# Patient Record
Sex: Male | Born: 1980 | Hispanic: No | Marital: Married | Smoking: Current some day smoker
Health system: Southern US, Community
[De-identification: ages and names within clinical notes are randomized; demographics above are authoritative.]

## PROBLEM LIST (undated history)

## (undated) ENCOUNTER — Emergency Department (HOSPITAL_COMMUNITY): Admission: EM | Payer: Self-pay

## (undated) DIAGNOSIS — A159 Respiratory tuberculosis unspecified: Secondary | ICD-10-CM

## (undated) DIAGNOSIS — K219 Gastro-esophageal reflux disease without esophagitis: Secondary | ICD-10-CM

## (undated) HISTORY — DX: Respiratory tuberculosis unspecified: A15.9

## (undated) HISTORY — DX: Gastro-esophageal reflux disease without esophagitis: K21.9

## (undated) HISTORY — PX: NO PAST SURGERIES: SHX2092

---

## 2014-01-08 ENCOUNTER — Other Ambulatory Visit: Payer: Self-pay | Admitting: Infectious Disease

## 2014-01-08 ENCOUNTER — Ambulatory Visit
Admission: RE | Admit: 2014-01-08 | Discharge: 2014-01-08 | Disposition: A | Discharge status: Home / Self Care | Payer: No Typology Code available for payment source | Source: Ambulatory Visit | Attending: Infectious Disease | Admitting: Infectious Disease

## 2014-01-08 DIAGNOSIS — A150 Tuberculosis of lung: Secondary | ICD-10-CM

## 2014-03-16 ENCOUNTER — Ambulatory Visit: Payer: Medicaid Other | Attending: Internal Medicine | Admitting: Internal Medicine

## 2014-03-16 ENCOUNTER — Encounter: Payer: Self-pay | Admitting: Internal Medicine

## 2014-03-16 VITALS — BP 119/71 | HR 94 | Temp 98.1°F | Resp 16 | Wt 138.8 lb

## 2014-03-16 DIAGNOSIS — R7611 Nonspecific reaction to tuberculin skin test without active tuberculosis: Secondary | ICD-10-CM | POA: Insufficient documentation

## 2014-03-16 DIAGNOSIS — L84 Corns and callosities: Secondary | ICD-10-CM | POA: Diagnosis not present

## 2014-03-16 DIAGNOSIS — Z139 Encounter for screening, unspecified: Secondary | ICD-10-CM | POA: Diagnosis not present

## 2014-03-16 DIAGNOSIS — M79609 Pain in unspecified limb: Secondary | ICD-10-CM | POA: Diagnosis not present

## 2014-03-16 DIAGNOSIS — F17200 Nicotine dependence, unspecified, uncomplicated: Secondary | ICD-10-CM | POA: Insufficient documentation

## 2014-03-16 DIAGNOSIS — K029 Dental caries, unspecified: Secondary | ICD-10-CM | POA: Insufficient documentation

## 2014-03-16 DIAGNOSIS — M79673 Pain in unspecified foot: Secondary | ICD-10-CM | POA: Insufficient documentation

## 2014-03-16 DIAGNOSIS — M79671 Pain in right foot: Secondary | ICD-10-CM

## 2014-03-16 LAB — COMPLETE METABOLIC PANEL WITH GFR
ALK PHOS: 63 U/L (ref 39–117)
ALT: 22 U/L (ref 0–53)
AST: 25 U/L (ref 0–37)
Albumin: 4.2 g/dL (ref 3.5–5.2)
BUN: 16 mg/dL (ref 6–23)
CO2: 29 mEq/L (ref 19–32)
CREATININE: 0.87 mg/dL (ref 0.50–1.35)
Calcium: 9.1 mg/dL (ref 8.4–10.5)
Chloride: 105 mEq/L (ref 96–112)
GFR, Est African American: >89 mL/min
GFR, Est Non African American: >89 mL/min
Glucose, Bld: 79 mg/dL (ref 70–99)
Potassium: 4.4 mEq/L (ref 3.5–5.3)
Sodium: 139 mEq/L (ref 135–145)
Total Bilirubin: 0.4 mg/dL (ref 0.2–1.2)
Total Protein: 6.8 g/dL (ref 6.0–8.3)

## 2014-03-16 LAB — CBC WITH DIFFERENTIAL/PLATELET
BASOS ABS: 0.1 10*3/uL (ref 0.0–0.1)
BASOS PCT: 1 % (ref 0–1)
EOS PCT: 4 % (ref 0–5)
Eosinophils Absolute: 0.2 10*3/uL (ref 0.0–0.7)
HEMATOCRIT: 43.5 % (ref 39.0–52.0)
Hemoglobin: 15.4 g/dL (ref 13.0–17.0)
Lymphocytes Relative: 28 % (ref 12–46)
Lymphs Abs: 1.5 10*3/uL (ref 0.7–4.0)
MCH: 30.9 pg (ref 26.0–34.0)
MCHC: 35.4 g/dL (ref 30.0–36.0)
MCV: 87.3 fL (ref 78.0–100.0)
MONO ABS: 0.4 10*3/uL (ref 0.1–1.0)
Monocytes Relative: 8 % (ref 3–12)
NEUTROS ABS: 3.2 10*3/uL (ref 1.7–7.7)
Neutrophils Relative %: 59 % (ref 43–77)
PLATELETS: 197 10*3/uL (ref 150–400)
RBC: 4.98 MIL/uL (ref 4.22–5.81)
RDW: 13.8 % (ref 11.5–15.5)
WBC: 5.4 10*3/uL (ref 4.0–10.5)

## 2014-03-16 LAB — LIPID PANEL
CHOL/HDL RATIO: 3.3 ratio
CHOLESTEROL: 130 mg/dL (ref 0–200)
HDL: 39 mg/dL — AB (ref >39)
LDL Cholesterol: 82 mg/dL (ref 0–99)
TRIGLYCERIDES: 43 mg/dL (ref <150)
VLDL: 9 mg/dL (ref 0–40)

## 2014-03-16 LAB — TSH: TSH: 1.848 u[IU]/mL (ref 0.350–4.500)

## 2014-03-16 MED ORDER — IBUPROFEN 600 MG PO TABS: 600.0000 mg | ORAL_TABLET | Freq: Three times a day (TID) | ORAL | Status: DC | PRN

## 2014-03-16 NOTE — Progress Notes (Signed)
Patient Demographics  Jesse Garza, is a 33 y.o. male  ZOX:[id number]  [id number]:[id number]  DOB - 06/12/1981  CC:  Chief Complaint  Patient presents with  . Establish Care       HPI: Jesse Garza is a 33 y.o. male here today to establish medical care. Patient has history of positive PPD and following up with health Department had a chest x-ray done in May which was negative for active disease he is on rifampin 600 mg for 4 months, patient denies any cough fever chills sweat loss, patient is complaining of right heel pain on and off for the last one year denies any fall or trauma. Patient also has lot of dental cavities and is requesting referral to see a dentist. Patient has No headache, No chest pain, No abdominal pain - No Nausea, No new weakness tingling or numbness, No Cough - SOB.  No Known Allergies Past Medical History  Diagnosis Date  . Tuberculosis    No current outpatient prescriptions on file prior to visit.   No current facility-administered medications on file prior to visit.   History reviewed. No pertinent family history. History   Social History  . Marital Status: Single    Spouse Name: N/A    Number of Children: N/A  . Years of Education: N/A   Occupational History  . Not on file.   Social History Main Topics  . Smoking status: Current Every Day Smoker  . Smokeless tobacco: Not on file  . Alcohol Use: No  . Drug Use: Not on file  . Sexual Activity: Not on file   Other Topics Concern  . Not on file   Social History Narrative  . No narrative on file    Review of Systems: Constitutional: Negative for fever, chills, diaphoresis, activity change, appetite change and fatigue. HENT: Negative for ear pain, nosebleeds, congestion, facial swelling, rhinorrhea, neck pain, neck stiffness and ear discharge.  Eyes: Negative for pain, discharge, redness, itching and visual disturbance. Respiratory: Negative for cough, choking, chest tightness, shortness  of breath, wheezing and stridor.  Cardiovascular: Negative for chest pain, palpitations and leg swelling. Gastrointestinal: Negative for abdominal distention. Genitourinary: Negative for dysuria, urgency, frequency, hematuria, flank pain, decreased urine volume, difficulty urinating and dyspareunia.  Musculoskeletal: Negative for back pain, joint swelling, arthralgia and gait problem. Neurological: Negative for dizziness, tremors, seizures, syncope, facial asymmetry, speech difficulty, weakness, light-headedness, numbness and headaches.  Hematological: Negative for adenopathy. Does not bruise/bleed easily. Psychiatric/Behavioral: Negative for hallucinations, behavioral problems, confusion, dysphoric mood, decreased concentration and agitation.    Objective:   Filed Vitals:   03/16/14 0937  BP: 119/71  Pulse: 94  Temp: 98.1 F (36.7 C)  Resp: 16    Physical Exam: Constitutional: Patient appears well-developed and well-nourished. No distress. HENT: Normocephalic, atraumatic, External right and left ear normal. Oropharynx is clear and moist.  Eyes: Conjunctivae and EOM are normal. PERRLA, no scleral icterus. Neck: Normal ROM. Neck supple. No JVD. No tracheal deviation. No thyromegaly. CVS: RRR, S1/S2 +, no murmurs, no gallops, no carotid bruit.  Pulmonary: Effort and breath sounds normal, no stridor, rhonchi, wheezes, rales.  Abdominal: Soft. BS +, no distension, tenderness, rebound or guarding.  Musculoskeletal: Normal range of motion. No edema and no tenderness. Right foot callus at heel minimal tenderness no erythema or discharge. Neuro: Alert. Normal reflexes, muscle tone coordination. No cranial nerve deficit. Skin: Skin is warm and dry. No rash noted. Not diaphoretic. No erythema. No pallor. Psychiatric: Normal mood  and affect. Behavior, judgment, thought content normal.  No results found for this basename: WBC, HGB, HCT, MCV, PLT   No results found for this basename:  CREATININE, BUN, NA, K, CL, CO2    No results found for this basename: HGBA1C   Lipid Panel  No results found for this basename: chol, trig, hdl, cholhdl, vldl, ldlcalc       Assessment and plan:   1. PPD positive Patient following up with  health Department and is on rifampin for 4 months  2. Smoking Advised patient to quit smoking.  3. Heel pain, right/4. Callus of heel  Advised patient to wear comfortable shoes. - DG Foot Complete Right; Future - ibuprofen (ADVIL,MOTRIN) 600 MG tablet; Take 1 tablet (600 mg total) by mouth every 8 (eight) hours as needed.  Dispense: 30 tablet; Refill: 1   5. Screening We'll do baseline blood work - CBC with Differential - COMPLETE METABOLIC PANEL WITH GFR - TSH - Lipid panel - Vit D  25 hydroxy (rtn osteoporosis monitoring)  6. Dental cavities  - Ambulatory referral to Dentistry   Return in about 3 months (around 06/16/2014).   Doris Cheadle, MD

## 2014-03-16 NOTE — Progress Notes (Signed)
Patient here with interpreter Here to establish care Complain of callus to bottom of his right foot That has been there over a year Currently taking medication for TB

## 2014-03-17 ENCOUNTER — Other Ambulatory Visit: Payer: Self-pay

## 2014-03-17 ENCOUNTER — Telehealth: Payer: Self-pay

## 2014-03-17 LAB — VITAMIN D 25 HYDROXY (VIT D DEFICIENCY, FRACTURES): Vit D, 25-Hydroxy: 18 ng/mL — ABNORMAL LOW (ref 30–89)

## 2014-03-17 MED ORDER — VITAMIN D (ERGOCALCIFEROL) 1.25 MG (50000 UNIT) PO CAPS: 50000.0000 [IU] | ORAL_CAPSULE | ORAL | Status: DC

## 2014-03-17 NOTE — Telephone Encounter (Signed)
Message copied by Lestine MountJUAREZ, Keino Placencia L on Wed Mar 17, 2014 10:44 AM ------      Message from: Doris CheadleADVANI, DEEPAK      Created: Wed Mar 17, 2014  9:35 AM       Blood work reviewed, noticed low vitamin D, call patient advise to start ergocalciferol 50,000 units once a week for the duration of  12 weeks.        ------

## 2014-03-17 NOTE — Telephone Encounter (Signed)
Interpreter line used Patient not available Message left to return our call 

## 2015-04-15 ENCOUNTER — Ambulatory Visit: Payer: Medicaid Other | Admitting: Family Medicine

## 2015-12-13 ENCOUNTER — Emergency Department (HOSPITAL_COMMUNITY)
Admission: EM | Admit: 2015-12-13 | Discharge: 2015-12-13 | Disposition: A | Discharge status: Home / Self Care | Payer: BLUE CROSS/BLUE SHIELD | Attending: Emergency Medicine | Admitting: Emergency Medicine

## 2015-12-13 ENCOUNTER — Emergency Department (HOSPITAL_COMMUNITY): Payer: BLUE CROSS/BLUE SHIELD

## 2015-12-13 ENCOUNTER — Encounter (HOSPITAL_COMMUNITY): Payer: Self-pay | Admitting: Emergency Medicine

## 2015-12-13 DIAGNOSIS — B349 Viral infection, unspecified: Secondary | ICD-10-CM | POA: Insufficient documentation

## 2015-12-13 DIAGNOSIS — F17200 Nicotine dependence, unspecified, uncomplicated: Secondary | ICD-10-CM | POA: Insufficient documentation

## 2015-12-13 DIAGNOSIS — Z8611 Personal history of tuberculosis: Secondary | ICD-10-CM | POA: Diagnosis not present

## 2015-12-13 DIAGNOSIS — R05 Cough: Secondary | ICD-10-CM | POA: Diagnosis present

## 2015-12-13 MED ORDER — BENZONATATE 100 MG PO CAPS: 100.0000 mg | ORAL_CAPSULE | Freq: Three times a day (TID) | ORAL | Status: DC

## 2015-12-13 NOTE — ED Notes (Signed)
Pt. reports persistent productive cough with nasal congestion / runny nose and chest congestion onset last week , respirations unlabored , denies fever or chills.

## 2015-12-13 NOTE — Discharge Instructions (Signed)
Please read and follow all provided instructions.  Your diagnoses today include:  1. Viral syndrome    You appear to have an upper respiratory infection (URI). An upper respiratory tract infection, or cold, is a viral infection of the air passages leading to the lungs. It should improve gradually after 5-7 days. You may have a lingering cough that lasts for 2- 4 weeks after the infection.  Tests performed today include:  Vital signs. See below for your results today.   Medications prescribed:   Take any prescribed medications only as directed. Treatment for your infection is aimed at treating the symptoms. There are no medications, such as antibiotics, that will cure your infection.   Home care instructions:  Follow any educational materials contained in this packet.   Your illness is contagious and can be spread to others, especially during the first 3 or 4 days. It cannot be cured by antibiotics or other medicines. Take basic precautions such as washing your hands often, covering your mouth when you cough or sneeze, and avoiding public places where you could spread your illness to others.   Please continue drinking plenty of fluids.  Use over-the-counter medicines as needed as directed on packaging for symptom relief.  You may also use ibuprofen or tylenol as directed on packaging for pain or fever.  Do not take multiple medicines containing Tylenol or acetaminophen to avoid taking too much of this medication.  Follow-up instructions: Please follow-up with your primary care provider in the next 3 days for further evaluation of your symptoms if you are not feeling better.   Return instructions:   Please return to the Emergency Department if you experience worsening symptoms.   RETURN IMMEDIATELY IF you develop shortness of breath, confusion or altered mental status, a new rash, become dizzy, faint, or poorly responsive, or are unable to be cared for at home.  Please return if you have  persistent vomiting and cannot keep down fluids or develop a fever that is not controlled by tylenol or motrin.    Please return if you have any other emergent concerns.  Additional Information:  Your vital signs today were: BP 117/72 mmHg   Pulse 94   Temp(Src) 99.4 F (37.4 C) (Oral)   Resp 16   Ht 5\' 7"  (1.702 m)   Wt 66.225 kg   BMI 22.86 kg/m2   SpO2 98% If your blood pressure (BP) was elevated above 135/85 this visit, please have this repeated by your doctor within one month. --------------

## 2015-12-13 NOTE — ED Provider Notes (Signed)
CSN: [id number]     Arrival date & time 12/13/15  2017 History   First MD Initiated Contact with Patient 12/13/15 2104     Chief Complaint  Patient presents with  . Cough  . Nasal Congestion   (Consider location/radiation/quality/duration/timing/severity/associated sxs/prior Treatment) HPI  35 y.o. male presents to the Emergency Department today complaining of URI symptoms x 6 days. Notes rhinorrhea, body aches, productive, cough, congestion. No CP/SOB/ABD pain. No N/V/D. No fevers. Notes sick contacts at work. Has tried OTC ibuprofen with minimal relief. No pain currently. No other symptoms noted.    Past Medical History  Diagnosis Date  . Tuberculosis    History reviewed. No pertinent past surgical history. No family history on file. Social History  Substance Use Topics  . Smoking status: Current Every Day Smoker  . Smokeless tobacco: None  . Alcohol Use: No    Review of Systems  Constitutional: Negative for fever.  Respiratory: Positive for cough. Negative for wheezing and stridor.   Cardiovascular: Negative for chest pain.  Gastrointestinal: Negative for nausea, vomiting and diarrhea.   Allergies  Review of patient's allergies indicates no known allergies.  Home Medications   Prior to Admission medications   Medication Sig Start Date End Date Taking? Authorizing Provider  ibuprofen (ADVIL,MOTRIN) 600 MG tablet Take 1 tablet (600 mg total) by mouth every 8 (eight) hours as needed. 03/16/14   Doris Cheadle, MD  Vitamin D, Ergocalciferol, (DRISDOL) 50000 UNITS CAPS capsule Take 1 capsule (50,000 Units total) by mouth every 7 (seven) days. 03/17/14   Doris Cheadle, MD   BP 117/72 mmHg  Pulse 94  Temp(Src) 99.4 F (37.4 C) (Oral)  Resp 16  Ht  (1.702 m)  Wt 66.225 kg  BMI 22.86 kg/m2  SpO2 98%\  Physical Exam  Constitutional: He is oriented to person, place, and time. He appears well-developed and well-nourished. No distress.  HENT:  Head: Normocephalic and  atraumatic.  Right Ear: Tympanic membrane, external ear and ear canal normal.  Left Ear: Tympanic membrane, external ear and ear canal normal.  Nose: Nose normal.  Mouth/Throat: Uvula is midline, oropharynx is clear and moist and mucous membranes are normal. No trismus in the jaw. No oropharyngeal exudate, posterior oropharyngeal erythema or tonsillar abscesses.  Eyes: EOM are normal. Pupils are equal, round, and reactive to light.  Neck: Normal range of motion. Neck supple. No tracheal deviation present.  Cardiovascular: Normal rate, regular rhythm, S1 normal, S2 normal, normal heart sounds, intact distal pulses and normal pulses.   Pulmonary/Chest: Effort normal and breath sounds normal. No respiratory distress. He has no decreased breath sounds. He has no wheezes. He has no rhonchi. He has no rales.  Abdominal: Normal appearance and bowel sounds are normal. There is no tenderness.  Musculoskeletal: Normal range of motion.  Neurological: He is alert and oriented to person, place, and time.  Skin: Skin is warm and dry.  Psychiatric: He has a normal mood and affect. His speech is normal and behavior is normal. Thought content normal.   ED Course  Procedures (including critical care time) Labs Review Labs Reviewed - No data to display  Imaging Review Dg Chest 2 View  12/13/2015  CLINICAL DATA:  Productive cough with nasal congestion EXAM: CHEST  2 VIEW COMPARISON:  01/08/2014 FINDINGS: Cardiac shadow is within normal limits. The lungs are well aerated bilaterally. No focal infiltrate or sizable effusion is seen. No acute bony abnormality is noted. IMPRESSION: No active cardiopulmonary disease. Electronically Signed  By: Alcide CleverMark  Lukens M.D.   On: 12/13/2015 20:52   I have personally reviewed and evaluated these images and lab results as part of my medical decision-making.   EKG Interpretation None      MDM  I have reviewed and evaluated the relevant imaging studies.  I have reviewed the  relevant previous healthcare records. I obtained HPI from historian.  ED Course:  Assessment: Pt is a 35yM presents with URI symptoms x 6 days . On exam, pt in NAD. VSS. Afebrile. Lungs CTA, Heart RRR. Abdomen nontender/soft. Pt CXR negative for acute infiltrate. Patients symptoms are consistent with URI, likely viral etiology. Discussed that antibiotics are not indicated for viral infections. Pt will be discharged with symptomatic treatment.  Given Rx Tessalon. Verbalizes understanding and is agreeable with plan. Pt is hemodynamically stable & in NAD prior to dc.  Disposition/Plan:  DC Home Additional Verbal discharge instructions given and discussed with patient.  Pt Instructed to f/u with PCP in the next week for evaluation and treatment of symptoms. Return precautions given Pt acknowledges and agrees with plan  Supervising Physician No att. providers found   Final diagnoses:  Viral syndrome       Audry Piliyler Devontay Celaya, PA-C 12/13/15 2210  Arby BarretteMarcy Pfeiffer, MD 12/13/15 2322

## 2015-12-13 NOTE — ED Notes (Signed)
While using interpreter phone pt st's he has had cold symptoms x's 1 week.   Pt denies any nausea, vomiting or diarrhea.

## 2015-12-22 ENCOUNTER — Emergency Department (HOSPITAL_COMMUNITY)
Admission: EM | Admit: 2015-12-22 | Discharge: 2015-12-22 | Disposition: A | Discharge status: Home / Self Care | Payer: BLUE CROSS/BLUE SHIELD | Attending: Emergency Medicine | Admitting: Emergency Medicine

## 2015-12-22 ENCOUNTER — Emergency Department (HOSPITAL_COMMUNITY): Payer: BLUE CROSS/BLUE SHIELD

## 2015-12-22 ENCOUNTER — Encounter (HOSPITAL_COMMUNITY): Payer: Self-pay | Admitting: *Deleted

## 2015-12-22 DIAGNOSIS — J988 Other specified respiratory disorders: Secondary | ICD-10-CM | POA: Insufficient documentation

## 2015-12-22 DIAGNOSIS — H5710 Ocular pain, unspecified eye: Secondary | ICD-10-CM | POA: Diagnosis not present

## 2015-12-22 DIAGNOSIS — Z79899 Other long term (current) drug therapy: Secondary | ICD-10-CM | POA: Diagnosis not present

## 2015-12-22 DIAGNOSIS — R067 Sneezing: Secondary | ICD-10-CM | POA: Diagnosis present

## 2015-12-22 DIAGNOSIS — R42 Dizziness and giddiness: Secondary | ICD-10-CM | POA: Diagnosis not present

## 2015-12-22 DIAGNOSIS — R51 Headache: Secondary | ICD-10-CM | POA: Diagnosis not present

## 2015-12-22 DIAGNOSIS — Z8611 Personal history of tuberculosis: Secondary | ICD-10-CM | POA: Diagnosis not present

## 2015-12-22 DIAGNOSIS — F17200 Nicotine dependence, unspecified, uncomplicated: Secondary | ICD-10-CM | POA: Diagnosis not present

## 2015-12-22 MED ORDER — PROMETHAZINE-DM 6.25-15 MG/5ML PO SYRP
5.0000 mL | ORAL_SOLUTION | Freq: Four times a day (QID) | ORAL | Status: DC | PRN
Start: 2015-12-22 — End: 2016-11-20

## 2015-12-22 MED ORDER — AMOXICILLIN 500 MG PO CAPS: 500.0000 mg | ORAL_CAPSULE | Freq: Three times a day (TID) | ORAL | Status: DC

## 2015-12-22 MED ORDER — GUAIFENESIN 100 MG/5ML PO LIQD: 100.0000 mg | ORAL | Status: DC | PRN

## 2015-12-22 NOTE — ED Provider Notes (Signed)
CSN: [id number]     Arrival date & time 12/22/15  1155 History  By signing my name below, I, Emmanuella Mensah, attest that this documentation has been prepared under the direction and in the presence of Fayrene HelperBowie Finn Amos, PA-C. Electronically Signed: Angelene GiovanniEmmanuella Mensah, ED Scribe. 12/22/2015. 1:33 PM.    No chief complaint on file.  The history is provided by the patient. A language interpreter was used (Pt speaks arabic).   HPI Comments: Jesse Garza is a 35 y.o. male with a hx of TB who presents to the Emergency Department complaining of gradually worsening persistent intermittent non-productive cough onset 2-3 weeks ago. Pt reports associated HA, dizziness, clear watery rhinorrhea, sneezing, and eye pain. Pt explains that he was seen on 12/13/15 where he had a normal chest x-ray and given Tessalon. Pt states that he is here today because his symptoms are still persisting with the Tessalon. He has been living in [place] for approx. 3 years. He denies any drug or cocaine use. He reports sick contacts at work with similar symptoms. Pt denies any fever, chills, eye itchiness, rash, or nasal congestion. Pt denies night sweat, fever, or hemoptysis.   Past Medical History  Diagnosis Date  . Tuberculosis    History reviewed. No pertinent past surgical history. No family history on file. Social History  Substance Use Topics  . Smoking status: Current Every Day Smoker  . Smokeless tobacco: None  . Alcohol Use: No    Review of Systems  Constitutional: Negative for fever and chills.  HENT: Positive for rhinorrhea and sneezing. Negative for congestion.   Eyes: Positive for pain. Negative for itching.  Respiratory: Positive for cough.   Skin: Negative for rash.  Neurological: Positive for dizziness and headaches.  All other systems reviewed and are negative.     Allergies  Review of patient's allergies indicates no known allergies.  Home Medications   Prior to Admission medications    Medication Sig Start Date End Date Taking? Authorizing Provider  benzonatate (TESSALON) 100 MG capsule Take 1 capsule (100 mg total) by mouth every 8 (eight) hours. 12/13/15   Audry Piliyler Mohr, PA-C  ibuprofen (ADVIL,MOTRIN) 600 MG tablet Take 1 tablet (600 mg total) by mouth every 8 (eight) hours as needed. 03/16/14   Doris Cheadleeepak Advani, MD  Vitamin D, Ergocalciferol, (DRISDOL) 50000 UNITS CAPS capsule Take 1 capsule (50,000 Units total) by mouth every 7 (seven) days. 03/17/14   Doris Cheadleeepak Advani, MD   BP 107/77 mmHg  Pulse 93  Temp(Src) 98.7 F (37.1 C) (Oral)  Resp 14  Ht 5\' 4"  (1.626 m)  Wt 148 lb (67.132 kg)  BMI 25.39 kg/m2  SpO2 100% Physical Exam  Constitutional: He is oriented to person, place, and time. He appears well-developed and well-nourished.  Non toxic looking  HENT:  Head: Normocephalic and atraumatic.  Nose: Rhinorrhea present.  Nasal congestion  Cardiovascular: Normal rate, regular rhythm and normal heart sounds.   Pulmonary/Chest: Effort normal.  Lymphadenopathy:    He has no cervical adenopathy.  Neurological: He is alert and oriented to person, place, and time.  Skin: Skin is warm and dry.  Psychiatric: He has a normal mood and affect.  Nursing note and vitals reviewed.   ED Course  Procedures (including critical care time) DIAGNOSTIC STUDIES: Oxygen Saturation is 100% on RA, normal by my interpretation.    COORDINATION OF CARE: 1:22 PM- Pt advised of plan for treatment and pt agrees. Explained that since pt's symptoms have been persisting for weeks, it is  likely that he will develop a bacterial infection and will prescribe antibiotics. Will complete work return note and give pt work note for 3 days. Pt request additional days but explained that in the ED, pt can only receive up to 3 days and will need to follow up with PCP for additional days.   MDM   Final diagnoses:  Respiratory infection    BP 107/77 mmHg  Pulse 93  Temp(Src) 98.7 F (37.1 C) (Oral)  Resp 14   Ht  (1.626 m)  Wt 67.132 kg  BMI 25.39 kg/m2  SpO2 100%   I personally performed the services described in this documentation, which was scribed in my presence. The recorded information has been reviewed and is accurate.     Fayrene Helper, PA-C 12/22/15 1340  Fayrene Helper, PA-C 12/22/15 1443  Gwyneth Sprout, MD 12/22/15 2227

## 2015-12-22 NOTE — ED Notes (Addendum)
Pt c/o cough, sneezing, HA x 3 wks, pt seen 12/13/15 for symptoms, dx with viral syndrome, presents to ED today c/o similar symptoms, needs return to work form to be completed for work, pt hx of TB, pt A&O x4

## 2015-12-22 NOTE — Discharge Instructions (Signed)
Upper Respiratory Infection, Adult Most upper respiratory infections (URIs) are a viral infection of the air passages leading to the lungs. A URI affects the nose, throat, and upper air passages. The most common type of URI is nasopharyngitis and is typically referred to as "the common cold." URIs run their course and usually go away on their own. Most of the time, a URI does not require medical attention, but sometimes a bacterial infection in the upper airways can follow a viral infection. This is called a secondary infection. Sinus and middle ear infections are common types of secondary upper respiratory infections. Bacterial pneumonia can also complicate a URI. A URI can worsen asthma and chronic obstructive pulmonary disease (COPD). Sometimes, these complications can require emergency medical care and may be life threatening.  CAUSES Almost all URIs are caused by viruses. A virus is a type of germ and can spread from one person to another.  RISKS FACTORS You may be at risk for a URI if:   You smoke.   You have chronic heart or lung disease.  You have a weakened defense (immune) system.   You are very young or very old.   You have nasal allergies or asthma.  You work in crowded or poorly ventilated areas.  You work in health care facilities or schools. SIGNS AND SYMPTOMS  Symptoms typically develop 2-3 days after you come in contact with a cold virus. Most viral URIs last 7-10 days. However, viral URIs from the influenza virus (flu virus) can last 14-18 days and are typically more severe. Symptoms may include:   Runny or stuffy (congested) nose.   Sneezing.   Cough.   Sore throat.   Headache.   Fatigue.   Fever.   Loss of appetite.   Pain in your forehead, behind your eyes, and over your cheekbones (sinus pain).  Muscle aches.  DIAGNOSIS  Your health care provider may diagnose a URI by:  Physical exam.  Tests to check that your symptoms are not due to  another condition such as:  Strep throat.  Sinusitis.  Pneumonia.  Asthma. TREATMENT  A URI goes away on its own with time. It cannot be cured with medicines, but medicines may be prescribed or recommended to relieve symptoms. Medicines may help:  Reduce your fever.  Reduce your cough.  Relieve nasal congestion. HOME CARE INSTRUCTIONS   Take medicines only as directed by your health care provider.   Gargle warm saltwater or take cough drops to comfort your throat as directed by your health care provider.  Use a warm mist humidifier or inhale steam from a shower to increase air moisture. This may make it easier to breathe.  Drink enough fluid to keep your urine clear or pale yellow.   Eat soups and other clear broths and maintain good nutrition.   Rest as needed.   Return to work when your temperature has returned to normal or as your health care provider advises. You may need to stay home longer to avoid infecting others. You can also use a face mask and careful hand washing to prevent spread of the virus.  Increase the usage of your inhaler if you have asthma.   Do not use any tobacco products, including cigarettes, chewing tobacco, or electronic cigarettes. If you need help quitting, ask your health care provider. PREVENTION  The best way to protect yourself from getting a cold is to practice good hygiene.   Avoid oral or hand contact with people with cold   symptoms.   Wash your hands often if contact occurs.  There is no clear evidence that vitamin C, vitamin E, echinacea, or exercise reduces the chance of developing a cold. However, it is always recommended to get plenty of rest, exercise, and practice good nutrition.  SEEK MEDICAL CARE IF:   You are getting worse rather than better.   Your symptoms are not controlled by medicine.   You have chills.  You have worsening shortness of breath.  You have brown or red mucus.  You have yellow or brown nasal  discharge.  You have pain in your face, especially when you bend forward.  You have a fever.  You have swollen neck glands.  You have pain while swallowing.  You have white areas in the back of your throat. SEEK IMMEDIATE MEDICAL CARE IF:   You have severe or persistent:  Headache.  Ear pain.  Sinus pain.  Chest pain.  You have chronic lung disease and any of the following:  Wheezing.  Prolonged cough.  Coughing up blood.  A change in your usual mucus.  You have a stiff neck.  You have changes in your:  Vision.  Hearing.  Thinking.  Mood. MAKE SURE YOU:   Understand these instructions.  Will watch your condition.  Will get help right away if you are not doing well or get worse.   This information is not intended to replace advice given to you by your health care provider. Make sure you discuss any questions you have with your health care provider.   Document Released: 01/23/2001 Document Revised: 12/14/2014 Document Reviewed: 11/04/2013 Elsevier Interactive Patient Education 2016 [employer]  

## 2016-05-04 ENCOUNTER — Ambulatory Visit (INDEPENDENT_AMBULATORY_CARE_PROVIDER_SITE_OTHER): Payer: BLUE CROSS/BLUE SHIELD

## 2016-05-04 ENCOUNTER — Ambulatory Visit (HOSPITAL_COMMUNITY)
Admission: EM | Admit: 2016-05-04 | Discharge: 2016-05-04 | Disposition: A | Discharge status: Home / Self Care | Payer: BLUE CROSS/BLUE SHIELD

## 2016-05-04 ENCOUNTER — Ambulatory Visit (HOSPITAL_COMMUNITY)
Admission: EM | Admit: 2016-05-04 | Discharge: 2016-05-04 | Disposition: A | Discharge status: Home / Self Care | Payer: BLUE CROSS/BLUE SHIELD | Attending: Internal Medicine | Admitting: Internal Medicine

## 2016-05-04 ENCOUNTER — Encounter (HOSPITAL_COMMUNITY): Payer: Self-pay | Admitting: Emergency Medicine

## 2016-05-04 DIAGNOSIS — R1012 Left upper quadrant pain: Secondary | ICD-10-CM

## 2016-05-04 DIAGNOSIS — K5900 Constipation, unspecified: Secondary | ICD-10-CM

## 2016-05-04 LAB — POCT I-STAT, CHEM 8
BUN: 15 mg/dL (ref 6–20)
CALCIUM ION: 1.18 mmol/L (ref 1.15–1.40)
CREATININE: 1 mg/dL (ref 0.61–1.24)
Chloride: 105 mmol/L (ref 101–111)
GLUCOSE: 98 mg/dL (ref 65–99)
HCT: 49 % (ref 39.0–52.0)
HEMOGLOBIN: 16.7 g/dL (ref 13.0–17.0)
Potassium: 4.7 mmol/L (ref 3.5–5.1)
Sodium: 141 mmol/L (ref 135–145)
TCO2: 27 mmol/L (ref 0–100)

## 2016-05-04 LAB — POCT H PYLORI SCREEN: H. PYLORI SCREEN, POC: NEGATIVE

## 2016-05-04 NOTE — Discharge Instructions (Signed)
1. Drink at least 8 large glasses of water daily and continue regular activities as                 tolerated.  °2. With each subsequent day with dealing with the constipation, please continue all the                ones that was/were started the previous day and add 1 new thing each day ° A 1 cup of prune juice and orange juice (warmed) orally twice daily ° B. mirlax 17 grams in large glass of water and follow with another glass of water                 up to twice daily. ° C.senna-S 2 orally up to twice daily ° D.mil of magnesia 30 ml orally up to twice daily ° E. Fleet enema rectally as needed ° °3. Once your bowel has moved, you can cut down on the above to regular bowl habit. Most people should have a movement every 1-2 days °4. NOTE: Do not start a fiber supplement during an episode of constipation. ° °

## 2016-05-04 NOTE — ED Provider Notes (Signed)
CSN: [id number]     Arrival date & time 05/04/16  1556 History   First MD Initiated Contact with Patient 05/04/16 1627     Chief Complaint  Patient presents with  . Abdominal Pain   (Consider location/radiation/quality/duration/timing/severity/associated sxs/prior Treatment) HPI History is obtained through an [place] interpreter. Patient is a 35 year old male who is complaining of some left upper quadrant pain that he has had for over 2 months now. He states that he has had problems with his bowels his stools are quite hard and infrequent. He denies any blood in the stools no other symptoms. He states he has had tuberculosis in the past. Does not have a primary medical provider. Denies history of sickle cell disease. Past Medical History:  Diagnosis Date  . Tuberculosis    History reviewed. No pertinent surgical history. History reviewed. No pertinent family history. Social History  Substance Use Topics  . Smoking status: Current Every Day Smoker  . Smokeless tobacco: Not on file  . Alcohol use No    Review of Systems  Denies: HEADACHE, NAUSEA, ABDOMINAL PAIN, CHEST PAIN, CONGESTION, DYSURIA, SHORTNESS OF BREATH  Allergies  Review of patient's allergies indicates no known allergies.  Home Medications   Prior to Admission medications   Medication Sig Start Date End Date Taking? Authorizing Provider  amoxicillin (AMOXIL) 500 MG capsule Take 1 capsule (500 mg total) by mouth 3 (three) times daily. 12/22/15   Fayrene HelperBowie Tran, PA-C  benzonatate (TESSALON) 100 MG capsule Take 1 capsule (100 mg total) by mouth every 8 (eight) hours. 12/13/15   Audry Piliyler Mohr, PA-C  guaiFENesin (ROBITUSSIN) 100 MG/5ML liquid Take 5-10 mLs (100-200 mg total) by mouth every 4 (four) hours as needed for congestion. 12/22/15   Fayrene HelperBowie Tran, PA-C  ibuprofen (ADVIL,MOTRIN) 600 MG tablet Take 1 tablet (600 mg total) by mouth every 8 (eight) hours as needed. 03/16/14   Doris Cheadleeepak Advani, MD  promethazine-dextromethorphan  (PROMETHAZINE-DM) 6.25-15 MG/5ML syrup Take 5 mLs by mouth 4 (four) times daily as needed for cough. 12/22/15   Fayrene HelperBowie Tran, PA-C  Vitamin D, Ergocalciferol, (DRISDOL) 50000 UNITS CAPS capsule Take 1 capsule (50,000 Units total) by mouth every 7 (seven) days. 03/17/14   Doris Cheadleeepak Advani, MD   Meds Ordered and Administered this Visit  Medications - No data to display  BP 108/74 (BP Location: Left Arm)   Pulse 92   Temp 98.9 F (37.2 C) (Oral)   Resp 16   SpO2 99%  No data found.   Physical Exam NURSES NOTES AND VITAL SIGNS REVIEWED. CONSTITUTIONAL: Well developed, well nourished, no acute distress HEENT: normocephalic, atraumatic EYES: Conjunctiva normal NECK:normal ROM, supple, no adenopathy PULMONARY:No respiratory distress, normal effort ABDOMINAL: Soft, ND, NT BS+, No CVAT, there is no splenomegaly. MUSCULOSKELETAL: Normal ROM of all extremities,  SKIN: warm and dry without rash PSYCHIATRIC: Mood and affect, behavior are normal  Urgent Care Course   Clinical Course    Procedures (including critical care time)  Labs Review Labs Reviewed  POCT I-STAT, CHEM 8  POCT H PYLORI SCREEN    Imaging Review Dg Abd 1 View  Result Date: 05/04/2016 CLINICAL DATA:  Left upper quadrant abdominal pain for 1 week. EXAM: ABDOMEN - 1 VIEW COMPARISON:  None. FINDINGS: The bowel gas pattern is unremarkable. No findings for small bowel obstruction or free air. The soft tissue shadows are maintained. No worrisome calcifications. The bony structures are unremarkable. IMPRESSION: Unremarkable abdominal radiograph. Electronically Signed   By: Rudie MeyerP.  Gallerani M.D.   On: 05/04/2016  18:06   Discussed with patient prior to discharge.  Visual Acuity Review  Right Eye Distance:   Left Eye Distance:   Bilateral Distance:    Right Eye Near:   Left Eye Near:    Bilateral Near:       As explained to patient through interpreter there is no definitive reason for him having left upper quadrant pain. This  may need to be further reviewed with proper imaging studies such as ultrasound or CT scan which needs to be organized outside around of urgent care. He is given instructions for constipation since he states that his stools are hard and infrequent although this is not apparent from his x-ray.  MDM   1. Left upper quadrant pain     Patient is reassured that there are no issues that require transfer to higher level of care at this time or additional tests. Patient is advised to continue home symptomatic treatment. Patient is advised that if there are new or worsening symptoms to attend the emergency department, contact primary care provider, or return to UC. Instructions of care provided discharged home in stable condition.    THIS NOTE WAS GENERATED USING A VOICE RECOGNITION SOFTWARE PROGRAM. ALL REASONABLE EFFORTS  WERE MADE TO PROOFREAD THIS DOCUMENT FOR ACCURACY.  I have verbally reviewed the discharge instructions with the patient. A printed AVS was given to the patient.  All questions were answered prior to discharge.      Tharon Aquas, PA 05/04/16 2059

## 2016-05-04 NOTE — ED Triage Notes (Signed)
[facility] with a complaint of LUQ abdominal pain x 1 week.

## 2016-11-07 ENCOUNTER — Encounter: Payer: Self-pay | Admitting: Physician Assistant

## 2016-11-20 ENCOUNTER — Encounter (INDEPENDENT_AMBULATORY_CARE_PROVIDER_SITE_OTHER): Payer: Self-pay

## 2016-11-20 ENCOUNTER — Encounter: Payer: Self-pay | Admitting: Physician Assistant

## 2016-11-20 ENCOUNTER — Ambulatory Visit (INDEPENDENT_AMBULATORY_CARE_PROVIDER_SITE_OTHER): Payer: BLUE CROSS/BLUE SHIELD | Admitting: Physician Assistant

## 2016-11-20 ENCOUNTER — Other Ambulatory Visit (INDEPENDENT_AMBULATORY_CARE_PROVIDER_SITE_OTHER): Payer: BLUE CROSS/BLUE SHIELD

## 2016-11-20 VITALS — BP 104/78 | HR 74 | Ht 65.5 in | Wt 151.0 lb

## 2016-11-20 DIAGNOSIS — R1010 Upper abdominal pain, unspecified: Secondary | ICD-10-CM | POA: Diagnosis not present

## 2016-11-20 DIAGNOSIS — R140 Abdominal distension (gaseous): Secondary | ICD-10-CM

## 2016-11-20 DIAGNOSIS — K219 Gastro-esophageal reflux disease without esophagitis: Secondary | ICD-10-CM

## 2016-11-20 LAB — CBC WITH DIFFERENTIAL/PLATELET
BASOS ABS: 0.1 10*3/uL (ref 0.0–0.1)
Basophils Relative: 1 % (ref 0.0–3.0)
Eosinophils Absolute: 0.2 10*3/uL (ref 0.0–0.7)
Eosinophils Relative: 3.2 % (ref 0.0–5.0)
HCT: 45.7 % (ref 39.0–52.0)
Hemoglobin: 15.9 g/dL (ref 13.0–17.0)
LYMPHS ABS: 1.9 10*3/uL (ref 0.7–4.0)
LYMPHS PCT: 27.7 % (ref 12.0–46.0)
MCHC: 34.9 g/dL (ref 30.0–36.0)
MCV: 90.4 fl (ref 78.0–100.0)
MONO ABS: 0.4 10*3/uL (ref 0.1–1.0)
Monocytes Relative: 6.1 % (ref 3.0–12.0)
NEUTROS ABS: 4.4 10*3/uL (ref 1.4–7.7)
NEUTROS PCT: 62 % (ref 43.0–77.0)
PLATELETS: 186 10*3/uL (ref 150.0–400.0)
RBC: 5.05 Mil/uL (ref 4.22–5.81)
RDW: 12.6 % (ref 11.5–15.5)
WBC: 7 10*3/uL (ref 4.0–10.5)

## 2016-11-20 LAB — H. PYLORI ANTIBODY, IGG: H Pylori IgG: POSITIVE — AB

## 2016-11-20 LAB — COMPREHENSIVE METABOLIC PANEL
ALT: 18 U/L (ref 0–53)
AST: 20 U/L (ref 0–37)
Albumin: 4.1 g/dL (ref 3.5–5.2)
Alkaline Phosphatase: 65 U/L (ref 39–117)
BILIRUBIN TOTAL: 0.4 mg/dL (ref 0.2–1.2)
BUN: 15 mg/dL (ref 6–23)
CHLORIDE: 107 meq/L (ref 96–112)
CO2: 27 meq/L (ref 19–32)
CREATININE: 0.96 mg/dL (ref 0.40–1.50)
Calcium: 9.1 mg/dL (ref 8.4–10.5)
GFR: 94.05 mL/min (ref >60.00)
GLUCOSE: 99 mg/dL (ref 70–99)
Potassium: 4.2 mEq/L (ref 3.5–5.1)
Sodium: 139 mEq/L (ref 135–145)
Total Protein: 7.1 g/dL (ref 6.0–8.3)

## 2016-11-20 LAB — SEDIMENTATION RATE: Sed Rate: 3 mm/hr (ref 0–15)

## 2016-11-20 MED ORDER — LANSOPRAZOLE 30 MG PO TBDP: 30.0000 mg | ORAL_TABLET | Freq: Every day | ORAL | 6 refills | Status: DC

## 2016-11-20 NOTE — Patient Instructions (Signed)
You have been scheduled for an abdominal ultrasound at [facility] (1st floor of hospital) on 11-27-16 at 8:30 am. Please arrive 15 minutes prior to your appointment for registration. Make certain not to have anything to eat or drink 6 hours prior to your appointment. Should you need to reschedule your appointment, please contact radiology at [phone]. This test typically takes about 30 minutes to perform.  Your physician has requested that you go to the basement for lab work before leaving today.  We have sent the following medications to your pharmacy for you to pick up at your convenience: Prevacid 30 mg every morning

## 2016-11-21 ENCOUNTER — Other Ambulatory Visit: Payer: Self-pay | Admitting: Emergency Medicine

## 2016-11-21 MED ORDER — LANSOPRAZOLE 30 MG PO CPDR: 30.0000 mg | DELAYED_RELEASE_CAPSULE | Freq: Every day | ORAL | 3 refills | Status: DC

## 2016-11-21 NOTE — Progress Notes (Signed)
Subjective:    Patient ID: Jesse Garza, male    DOB: 1980/12/16, 36 y.o.   MRN: [id number]  HPI Rochelle is a 36 year old African male from [place] who has been in the [place] for 4 years. He is accompanied by an interpreter. This appointment was made by his Child psychotherapist. He comes in with complaints of upper abdominal pain. Patient does not have much information available in EPIC. He was PPD positive and treated for 6 months in 2015. He has not had any prior GI investigation. He says he has had his current symptoms over the past year, with ongoing difficulty with gas and bloating and what he describes as burning in his esophagus which apparently is worse with oily foods and spicy foods. He says if his symptoms get bad he will have vomiting. He has no complaints of dysphagia or odynophagia. Appetite has been okay weight may be down a few pounds. He has no complaints of issues with his bowels, no melena or hematochezia. He denies any regular aspirin or NSAID use and no regular EtOH use. He has not had any prior abdominal surgery. He is unaware of any family history of intestinal issues.  Review of Systems Pertinent positive and negative review of systems were noted in the above HPI section.  All other review of systems was otherwise negative.  Outpatient Encounter Prescriptions as of 11/20/2016  Medication Sig  . lansoprazole (PREVACID SOLUTAB) 30 MG disintegrating tablet Take 1 tablet (30 mg total) by mouth daily.  . [DISCONTINUED] amoxicillin (AMOXIL) 500 MG capsule Take 1 capsule (500 mg total) by mouth 3 (three) times daily.  . [DISCONTINUED] benzonatate (TESSALON) 100 MG capsule Take 1 capsule (100 mg total) by mouth every 8 (eight) hours.  . [DISCONTINUED] guaiFENesin (ROBITUSSIN) 100 MG/5ML liquid Take 5-10 mLs (100-200 mg total) by mouth every 4 (four) hours as needed for congestion.  . [DISCONTINUED] ibuprofen (ADVIL,MOTRIN) 600 MG tablet Take 1 tablet (600 mg total)  by mouth every 8 (eight) hours as needed.  . [DISCONTINUED] promethazine-dextromethorphan (PROMETHAZINE-DM) 6.25-15 MG/5ML syrup Take 5 mLs by mouth 4 (four) times daily as needed for cough.  . [DISCONTINUED] Vitamin D, Ergocalciferol, (DRISDOL) 50000 UNITS CAPS capsule Take 1 capsule (50,000 Units total) by mouth every 7 (seven) days.   No facility-administered encounter medications on file as of 11/20/2016.    No Known Allergies Patient Active Problem List   Diagnosis Date Noted  . 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 Smoking 03/16/2014  . Heel pain 03/16/2014  . Callus of heel 03/16/2014  . Dental cavities 03/16/2014   Social History   Social History  . Marital status: Married    Spouse name: N/A  . Number of children: N/A  . Years of education: N/A   Occupational History  . Not on file.   Social History Main Topics  . Smoking status: Current Every Day Smoker    Types: Cigarettes  . Smokeless tobacco: Not on file  . Alcohol use No  . Drug use: Unknown  . Sexual activity: Not on file   Other Topics Concern  . Not on file   Social History Narrative  . No narrative on file    Mr. Maltese's family history is not on file.      Objective:    Vitals:   11/20/16 0946  BP: 104/78  Pulse: 74    Physical Exam  ;Well-developed African male in no acute distress, accompanied by an interpreter, blood pressure 104/78 pulse 74, height  5 foot 5, weight 151, BMI 24.7. HEENT; nontraumatic normocephalic EOMI PERRLA sclera anicteric, Cardiovascular; regular rate and rhythm with S1-S2 no murmur or gallop, Pulmonary; clear bilaterally, Abdomen; soft, he has some mild tenderness in the upper abdomen, no appreciable fluid wave or hepatosplenomegaly, bowel sounds are present, Rectal; exam not done, Extremities; no clubbing cyanosis or edema skin warm and dry, Neuropsych ;mood and affect appropriate       Assessment & Plan:   #40 36 year old African male, non-English speaking with  complaints of 1 year history of upper abdominal pain burning, heartburn gas and bloating. Rule out GERD, rule out chronic gastropathy, H. pylori gastritis, peptic ulcer disease or possible gallbladder disease. #2 previous history positive PPD treated 2015  Plan; Will check CBC with differential, CMET, and H. pylori antibody. Start Prevacid solutab 30 mg by mouth every morning. Patient relates difficulty swallowing pills Check upper abdominal ultrasound. Treat H. pylori if positive. I plan to follow up in office in 2-3 weeks to reassess and review labs and ultrasound.  Leonidas Boateng S Smera Guyette PA-C 11/21/2016   Cc: No ref. provider found

## 2016-11-26 NOTE — Progress Notes (Signed)
Reviewed and agree with documentation and assessment and plan. K. Veena Nandigam , MD   

## 2016-11-27 ENCOUNTER — Ambulatory Visit (HOSPITAL_COMMUNITY)
Admission: RE | Admit: 2016-11-27 | Discharge: 2016-11-27 | Disposition: A | Discharge status: Home / Self Care | Payer: BLUE CROSS/BLUE SHIELD | Source: Ambulatory Visit | Attending: Physician Assistant | Admitting: Physician Assistant

## 2016-11-27 DIAGNOSIS — R932 Abnormal findings on diagnostic imaging of liver and biliary tract: Secondary | ICD-10-CM | POA: Diagnosis not present

## 2016-11-27 DIAGNOSIS — R140 Abdominal distension (gaseous): Secondary | ICD-10-CM

## 2016-11-27 DIAGNOSIS — R1010 Upper abdominal pain, unspecified: Secondary | ICD-10-CM | POA: Diagnosis present

## 2016-11-27 DIAGNOSIS — K219 Gastro-esophageal reflux disease without esophagitis: Secondary | ICD-10-CM

## 2016-12-05 ENCOUNTER — Ambulatory Visit: Payer: BLUE CROSS/BLUE SHIELD | Admitting: Physician Assistant

## 2016-12-05 ENCOUNTER — Encounter: Payer: Self-pay | Admitting: Physician Assistant

## 2016-12-05 ENCOUNTER — Ambulatory Visit (INDEPENDENT_AMBULATORY_CARE_PROVIDER_SITE_OTHER): Payer: BLUE CROSS/BLUE SHIELD | Admitting: Physician Assistant

## 2016-12-05 VITALS — BP 100/62 | HR 89 | Ht 65.0 in | Wt 151.0 lb

## 2016-12-05 DIAGNOSIS — A048 Other specified bacterial intestinal infections: Secondary | ICD-10-CM

## 2016-12-05 DIAGNOSIS — K2970 Gastritis, unspecified, without bleeding: Secondary | ICD-10-CM

## 2016-12-05 MED ORDER — OMEPRAZOLE 40 MG PO CPDR: 40.0000 mg | DELAYED_RELEASE_CAPSULE | Freq: Every day | ORAL | 0 refills | Status: DC

## 2016-12-05 MED ORDER — BIS SUBCIT-METRONID-TETRACYC 140-125-125 MG PO CAPS: 3.0000 | ORAL_CAPSULE | Freq: Three times a day (TID) | ORAL | 0 refills | Status: DC

## 2016-12-05 NOTE — Patient Instructions (Addendum)
We have given you samples of Pylera today.  We have sent the following medications to your pharmacy for you to pick up at your convenience: Prilosec. Continue Prevacid for 30 days daily until gone then start Prilosec  in the morning for 1 month. You may return to work 12/20/2016.  The office will call you to schedule an appointment in June.  Normal BMI (Body Mass Index- based on height and weight) is between 19 and 25. Your BMI today is Body mass index is 25.13 kg/m. Marland Kitchen Please consider follow up  regarding your BMI with your Primary Care Provider.  Thank you,

## 2016-12-05 NOTE — Progress Notes (Signed)
Subjective:    Patient ID: Jesse Garza, male    DOB: February 24, 1981, 36 y.o.   MRN: [id number]  HPI Jesse Garza is a 36 year old African male from [place], non-English speaking who comes in today for follow-up with an interpreter. He was initially seen by myself about 2 weeks ago with complaints of epigastric abdominal discomfort and burning gas and bloating which had been present for about 1 year. Patient has been living in the [place] over the past 4 years, he was PPD positive in 2015 and treated. He had had no prior history of GI problems. Labs were checked as was H. pylori antibody. Labs were unremarkable, H. pylori antibody positive. Upper abdominal ultrasound was also done which was unremarkable with the exception of a very small 1.2 x 1 cm septated cyst in the right lobe of the liver. This was felt to be benign, consider follow-up ultrasound in 6 months. Patient was started on a trial of Prevacid solu tabs 30 mg by mouth daily because he said that he had difficulty swallowing pills.   He comes back in today for follow-up and states that the medication is definitely helping. Apparently this was very expensive, his insurance did not cover and he is taking tablets rather than  solutab. Nevertheless he says his stomach does feel better though symptoms have not completely resolved. He reports that he has been staying out of work over the past couple of weeks. He works at a [employer] and says that he wanted to be well before he went back to work. Appetite has been okay, weight stable no diarrhea, no fevers etc.  Review of Systems Pertinent positive and negative review of systems were noted in the above HPI section.  All other review of systems was otherwise negative.  Outpatient Encounter Prescriptions as of 12/05/2016  Medication Sig  . lansoprazole (PREVACID) 30 MG capsule Take 1 capsule (30 mg total) by mouth daily at 12 noon.  . bismuth-metronidazole-tetracycline (PYLERA) 140-125-125 MG  capsule Take 3 capsules by mouth 4 (four) times daily -  before meals and at bedtime.  Marland Kitchen omeprazole (PRILOSEC) 40 MG capsule Take 1 capsule (40 mg total) by mouth daily.   No facility-administered encounter medications on file as of 12/05/2016.    No Known Allergies Patient Active Problem List   Diagnosis Date Noted  . 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 Smoking 03/16/2014  . Heel pain 03/16/2014  . Callus of heel 03/16/2014  . Dental cavities 03/16/2014   Social History   Social History  . Marital status: Married    Spouse name: N/A  . Number of children: N/A  . Years of education: N/A   Occupational History  . Not on file.   Social History Main Topics  . Smoking status: Current Every Day Smoker    Types: Cigarettes  . Smokeless tobacco: Never Used  . Alcohol use No  . Drug use: No  . Sexual activity: Not Currently   Other Topics Concern  . Not on file   Social History Narrative  . No narrative on file    Jesse Garza's family history is not on file.      Objective:    Vitals:   12/05/16 0838  BP: 100/62  Pulse: 89    Physical Exam   well-developed African male in no acute distress, accompanied by interpreter, blood pressure 100/62, pulse 89, height 5 foot 5, weight 151, BMI 25.1. HEENT nontraumatic normocephalic EOMI PERRLA sclera anicteric, Cardiovascular; regular rate and rhythm  with S1-S2 no murmur or gallop, capillary clear bilaterally, Abdomen; soft minimally tender in the epigastrium there is no guarding or rebound no palpable mass or hepatosplenomegaly bowel sounds are present, Rectal ;not done, Neuropsych; mood and affect appropriate;       Assessment & Plan:   #14 36 year old African male, non-English speaking who was initially seen 2 weeks ago with 1 year history of epigastric discomfort gas bloating and burning. He has symptomatically improved on Prevacid though symptoms haven't resolved. H. pylori antibody positive -will treat  #2 tiny septated  right hepatic lobe cyst on ultrasound consider follow-up 6 months  Plan; patient will continue current prescription of Prevacid 30 mg by mouth every morning until gone then have sent new prescription for Prilosec 40 mg by mouth every morning which should be much cheaper, to take for another month He was given a sample pack of Pylera today to complete 10 days. Instructions were reviewed carefully with the patient via the interpreter. Patient asked for a note for work as he has been out over the past couple of weeks and does not want to return until his stomach issues have resolved. Note given, (explained to patient that from a medical standpoint he should be fine to return to work, he states that he is not being paid while out just holding his position). We will plan to follow up in about 6 weeks, and we will check H. pylori stool antigen at time.   Phoenyx Melka S Kaiea Esselman PA-C 12/05/2016   Cc: No ref. provider found

## 2016-12-05 NOTE — Progress Notes (Signed)
Reviewed and agree with documentation and assessment and plan. K. Veena Kriya Westra , MD   

## 2017-01-11 ENCOUNTER — Other Ambulatory Visit: Payer: Self-pay

## 2017-01-25 ENCOUNTER — Encounter: Payer: Self-pay | Admitting: Physician Assistant

## 2017-01-25 ENCOUNTER — Ambulatory Visit (INDEPENDENT_AMBULATORY_CARE_PROVIDER_SITE_OTHER): Payer: BLUE CROSS/BLUE SHIELD | Admitting: Physician Assistant

## 2017-01-25 VITALS — BP 92/64 | HR 92 | Ht 65.25 in | Wt 148.0 lb

## 2017-01-25 DIAGNOSIS — R1319 Other dysphagia: Secondary | ICD-10-CM

## 2017-01-25 DIAGNOSIS — K219 Gastro-esophageal reflux disease without esophagitis: Secondary | ICD-10-CM

## 2017-01-25 MED ORDER — OMEPRAZOLE 40 MG PO CPDR: DELAYED_RELEASE_CAPSULE | ORAL | 3 refills | Status: DC

## 2017-01-25 MED ORDER — SUCRALFATE 1 GM/10ML PO SUSP: 1.0000 g | Freq: Three times a day (TID) | ORAL | 0 refills | Status: DC

## 2017-01-25 NOTE — Progress Notes (Signed)
Subjective:    Patient ID: Jesse Garza, male    DOB: 02/01/1981, 36 y.o.   MRN: [id number]  HPI Betzalel is a 36 year old African-American male from [place], non-English-speaking who comes in today for follow-up accompanied by an interpreter. He was initially seen by myself in April 2018 and again on 12/05/2016 with complaints of epigastric discomfort, abdominal gas and burning and bloating. His symptoms have been present for about a year. He's been in the [place] for 4 years, he was PPD positive in 2015 and treated. No previous history of GI disease. He was H. pylori antibody positive and was started on Pylera at the time of last office visit. Upper abdominal ultrasound has been done and unremarkable with the exception of very small 1.2 x 1 cm septated cyst in the right lobe of the liver. Consider follow-up ultrasound in 6 months. He has asked me to sign papers for his employer to cover him while he has been ill. He has not been to work in over a month due to his GI symptoms.. Apparently is not being paid for his absence this is just helping to hold his position. We did not specifically tell him that he was unable to work but he has not felt that he has been well enough to work. Today it appears that he has completed most of the Pylera but may have a few tablets remaining. He indicates that he completed the other medicines we gave him which were Prevacid and Prilosec. He says that his abdominal discomfort is better but now is having persistent burning in his chest. He states she's not been eating much other than bread and water because of this burning. Looking at his weight he has been relatively stable. There is definite language barrier but I cannot tell that he specifically complaining of dysphagia though he doesn't like to swallow pills. He is asking for liquid medication.  Review of Systems Pertinent positive and negative review of systems were noted in the above HPI section.  All other  review of systems was otherwise negative.  Outpatient Encounter Prescriptions as of 01/25/2017  Medication Sig  . bismuth-metronidazole-tetracycline (PYLERA) 140-125-125 MG capsule Take 3 capsules by mouth 4 (four) times daily -  before meals and at bedtime.  . lansoprazole (PREVACID) 30 MG capsule Take 1 capsule (30 mg total) by mouth daily at 12 noon.  Marland Kitchen omeprazole (PRILOSEC) 40 MG capsule Take 1 tab twice daily  . [DISCONTINUED] omeprazole (PRILOSEC) 40 MG capsule Take 1 capsule (40 mg total) by mouth daily.  . sucralfate (CARAFATE) 1 GM/10ML suspension Take 10 mLs (1 g total) by mouth 4 (four) times daily -  with meals and at bedtime. For 14 days.   No facility-administered encounter medications on file as of 01/25/2017.    No Known Allergies Patient Active Problem List   Diagnosis Date Noted  . 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 Smoking 03/16/2014  . Heel pain 03/16/2014  . Callus of heel 03/16/2014  . Dental cavities 03/16/2014   Social History   Social History  . Marital status: Married    Spouse name: N/A  . Number of children: N/A  . Years of education: N/A   Occupational History  . Not on file.   Social History Main Topics  . Smoking status: Current Some Day Smoker    Types: Cigarettes  . Smokeless tobacco: Never Used  . Alcohol use No  . Drug use: No  . Sexual activity: Not Currently   Other  Topics Concern  . Not on file   Social History Narrative  . No narrative on file    Mr. Frane's family history is not on file.      Objective:    Vitals:   01/25/17 1445 01/25/17 1525  BP: (!) 88/58 92/64  Pulse: 92     Physical Exam well-developed young African male in no acute distress, accompanied by an interpreter. Repeat blood pressure 92/66, pulse 90, weight 148, BMI 24.4. HEENT ;nontraumatic normocephalic EOMI PERRLA sclera anicteric, not further examined today       Assessment & Plan:   #471 36 year old African male from [place] with persistent GI  complaints. When initially seen about 2 months ago was complaining primarily of epigastric discomfort. He was H. pylori antibody positive and has received a course of Pylera though it is not entirely whether he finished the entire course. #2 persistent complaints of burning in the chest-most consistent with GERD, possible esophagitis. Question component of dysphagia #3 language barrier #4  1.2 x 1 cm septated cyst in the right lobe of the liver-six-month follow-up advised  Plan; patient has to push fluids and bland food Start Carafate suspension 1 g between meals and at bedtime 2 weeks Restart omeprazole 40 mg by mouth twice a day Patient will be scheduled for EGD with Dr. Lavon PaganiniNandigam. Her seizure discussed in detail with the patient via the interpreter including risks and benefits and he is agreeable to proceed. I have signed a formed from his employer covering him through 12/31/2016. EGD is to be done next week. Will need follow-up ultrasound, or MRI of the liver in 4 months.  Amy S Esterwood PA-C 01/25/2017   Cc: No ref. provider found

## 2017-01-25 NOTE — Patient Instructions (Addendum)
We sent Carafate liquid to your pharmacy.[facility] [address].  Also Omeprazole 40 mg , take 1 tab twice daily.  You have been scheduled for an endoscopy. Please follow written instructions given to you at your visit today. If you use inhalers (even only as needed), please bring them with you on the day of your procedure. Your physician has requested that you go to www.startemmi.com and enter the access code given to you at your visit today. This web site gives a general overview about your procedure. However, you should still follow specific instructions given to you by our office regarding your preparation for the procedure.

## 2017-01-29 ENCOUNTER — Encounter: Payer: BLUE CROSS/BLUE SHIELD | Admitting: Gastroenterology

## 2017-01-29 ENCOUNTER — Telehealth: Payer: Self-pay | Admitting: Gastroenterology

## 2017-01-29 NOTE — Telephone Encounter (Signed)
Ok, please request interpreter for pre visit appointment. Thanks

## 2017-01-29 NOTE — Progress Notes (Signed)
Reviewed and agree with documentation and assessment and plan. K. Veena Nandigam , MD   

## 2017-01-29 NOTE — Telephone Encounter (Signed)
Walked in to the office for EGD. Told the our front office person that he could not have procedure today. Due to a big language barrier he is unable to reschedule now. Says he will have someone call and reschedule.

## 2017-02-15 NOTE — Congregational Nurse Program (Signed)
Congregational Nurse Program Note  Date of Encounter: 02/15/2017  Past Medical History: Past Medical History:  Diagnosis Date  . GERD (gastroesophageal reflux disease)   . Tuberculosis     Encounter Details:     CNP Questionnaire - 02/15/17 1235      Patient Demographics   Is this a new or existing patient? New   Patient is considered a/an Refugee   Race African     Patient Assistance   Location of Patient Assistance [place]   Patient's financial/insurance status Private Insurance Coverage   Uninsured Patient ([place] [employer]s) No   Patient referred to apply for the following financial assistance Not Applicable   Food insecurities addressed Not Applicable   Transportation assistance No   Assistance securing medications No   Educational health offerings Exercise/physical activity;Health literacy;Navigating the healthcare system;Nutrition     Encounter Details   Primary purpose of visit Other  blood pressure check   Was an Emergency Department visit averted? Not Applicable   Does patient have a medical provider? Yes   Patient referred to Not Applicable   Was a mental health screening completed? (GAINS tool) No   Does patient have dental issues? No   Does patient have vision issues? No   Does your patient have an abnormal blood pressure today? No   Since previous encounter, have you referred patient for abnormal blood pressure that resulted in a new diagnosis or medication change? No   Does your patient have an abnormal blood glucose today? No   Since previous encounter, have you referred patient for abnormal blood glucose that resulted in a new diagnosis or medication change? No   Was there a life-saving intervention made? No    Client stopped by the center for blood pressure checks.Has no health related concerns. Nicole Cellaorothy Muhoro RN BSN PCCN CNP [phone]

## 2018-10-04 IMAGING — US US ABDOMEN COMPLETE
1 series · 13 of 25 positions shown · non-contrast
Comparison: None in PACs

CLINICAL DATA: Upper abdominal pain for the past year. History of
gastroesophageal reflux.

EXAM:
ABDOMEN ULTRASOUND COMPLETE

[Series 1: us abdomen complete · 0.22mm/px · 13 of 73 slices shown]
[im 1/73]
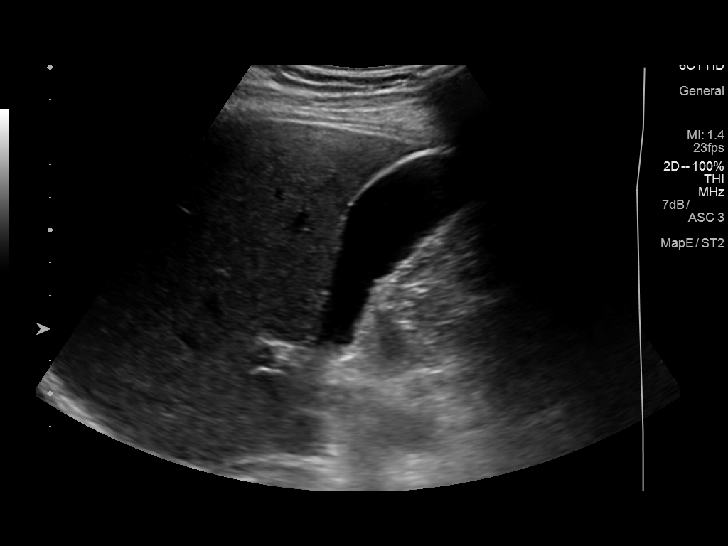
[im 7/73]
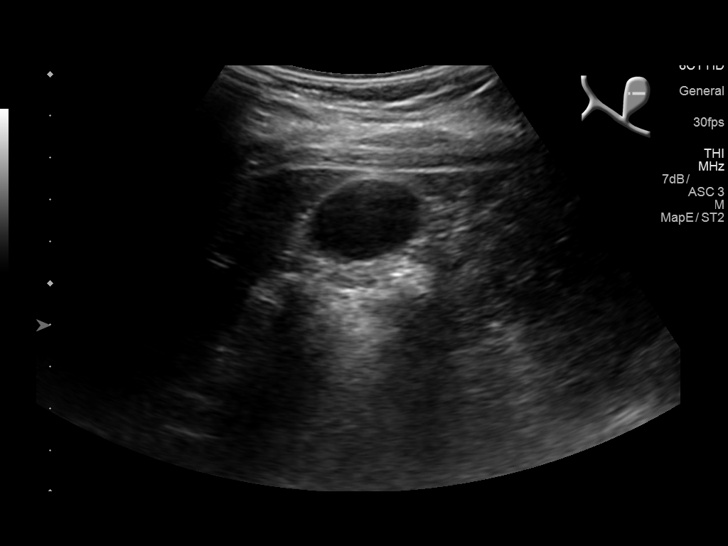
[im 13/73]
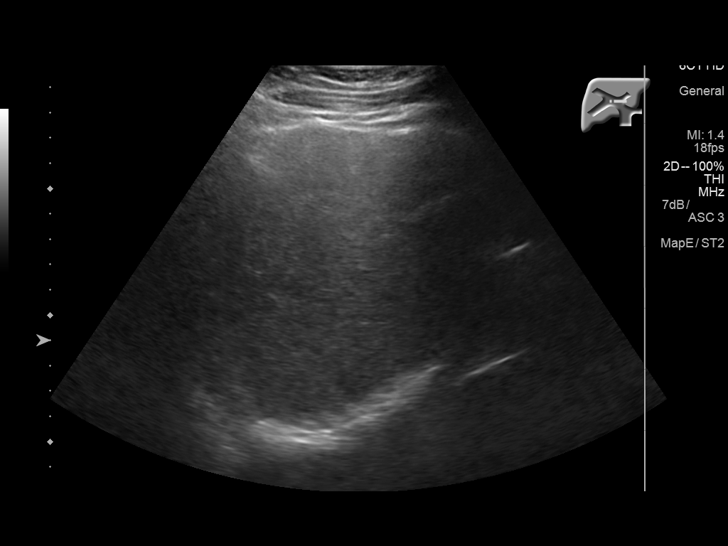
[im 19/73]
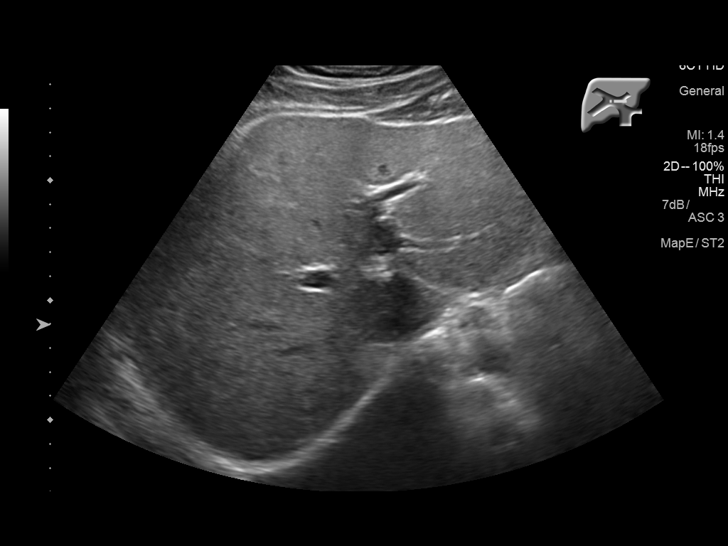
[im 25/73]
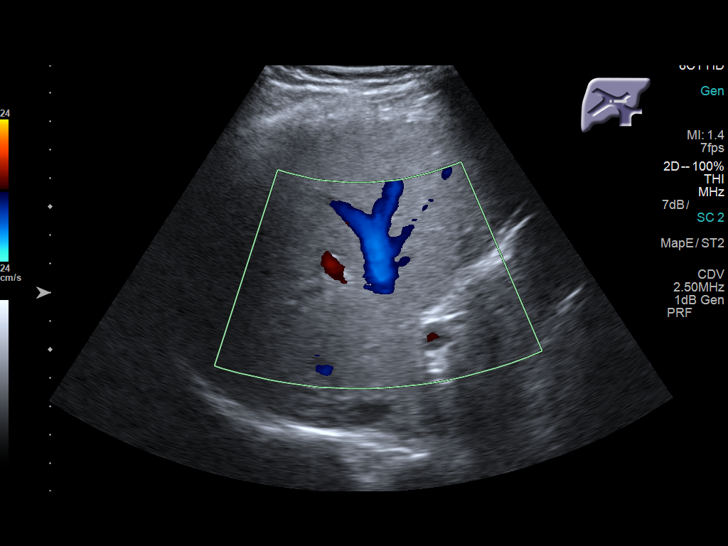
[im 31/73]
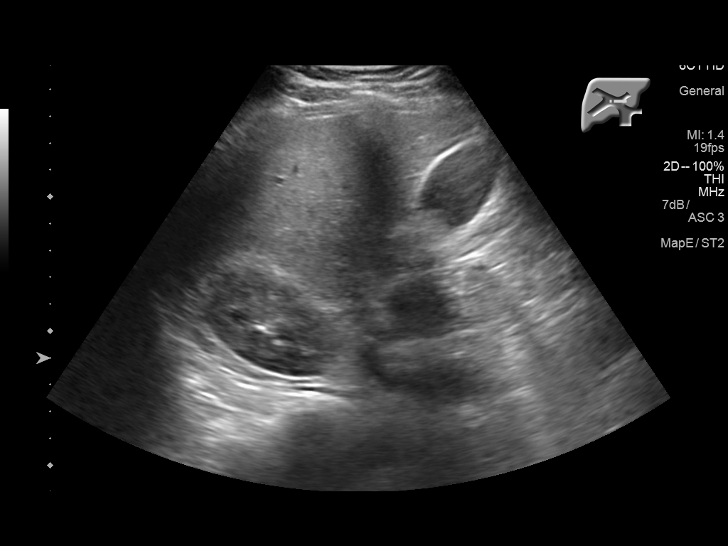
[im 37/73]
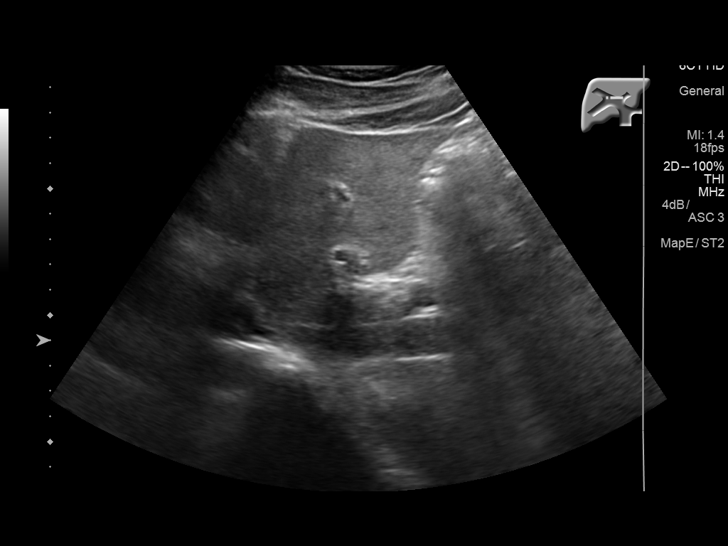
[im 43/73]
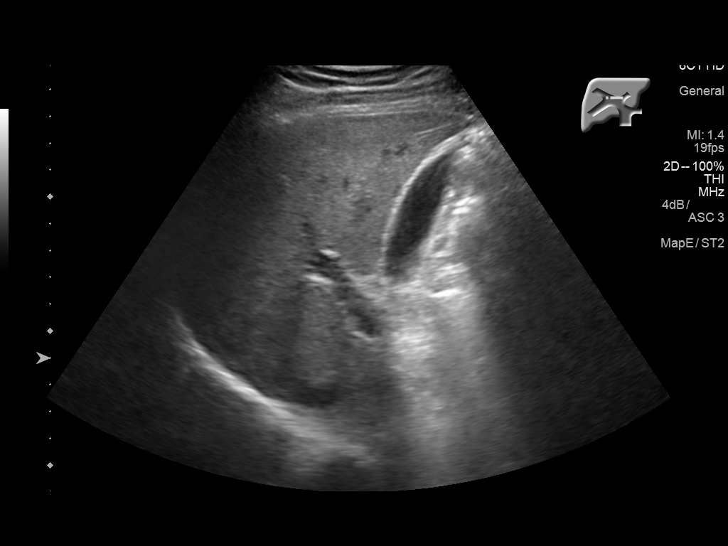
[im 49/73]
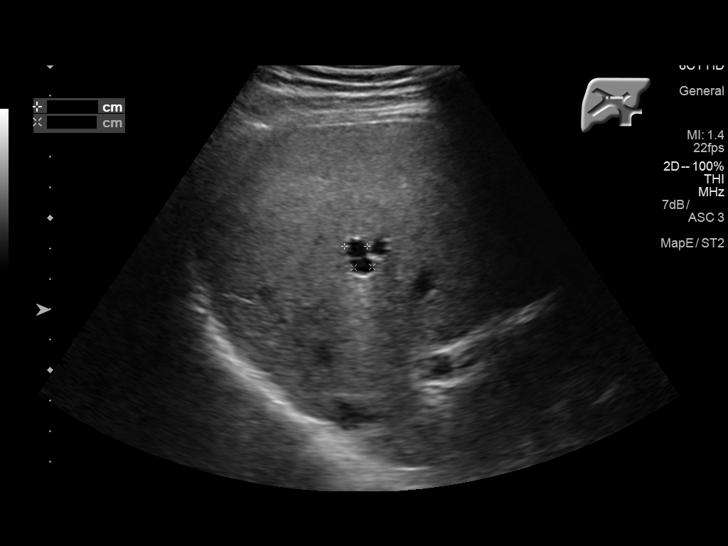
[im 55/73]
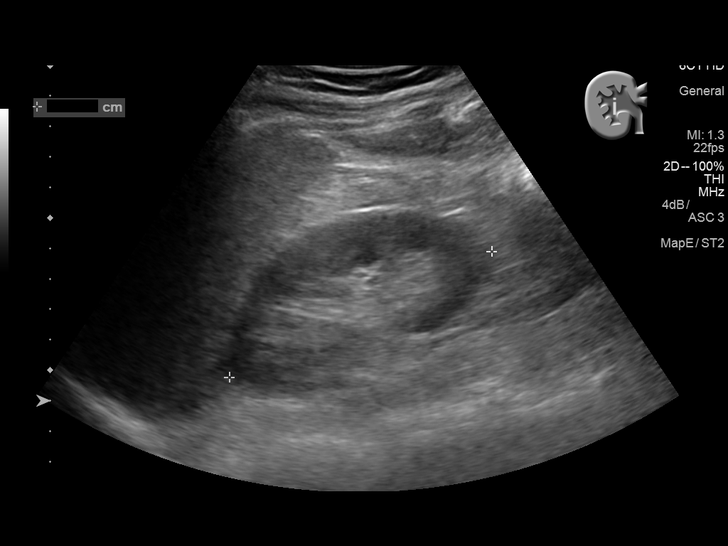
[im 61/73]
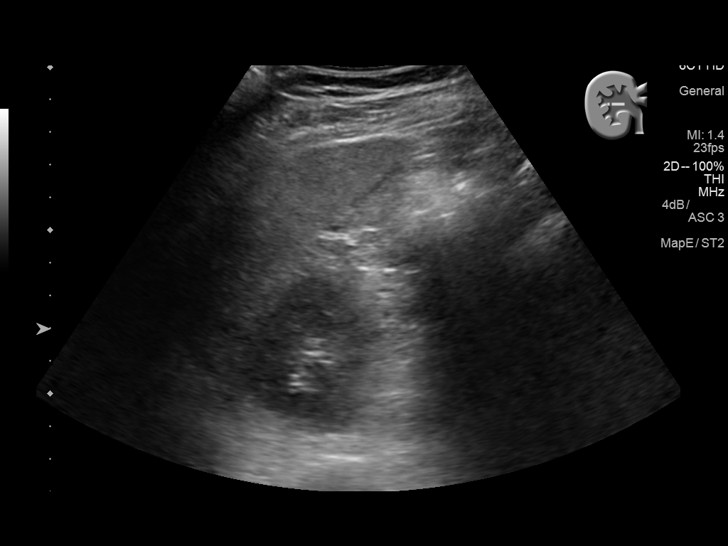
[im 67/73]
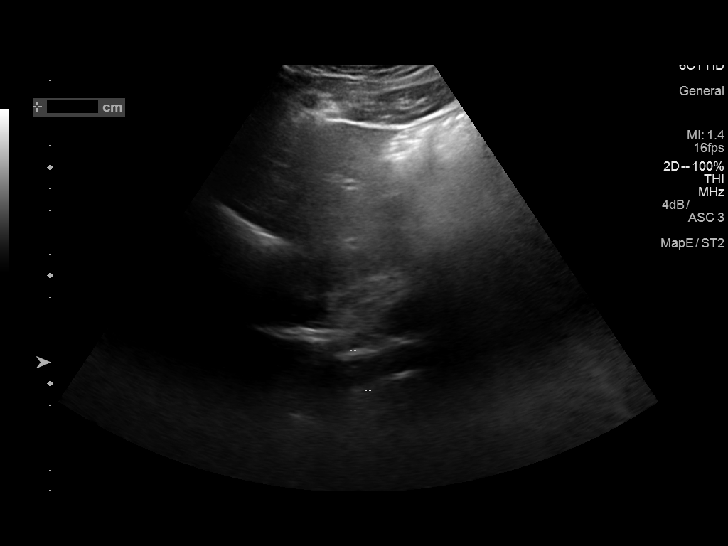
[im 73/73]
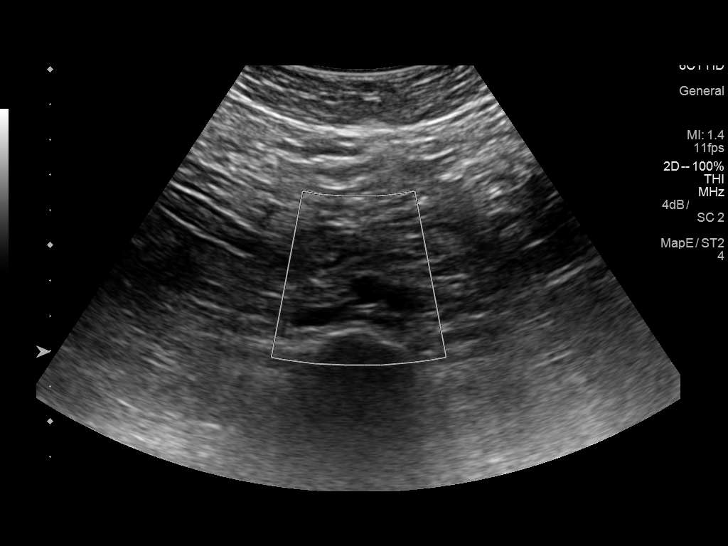

[13 of 25 positions shown; findings below may reference images not displayed]

FINDINGS: Gallbladder: No gallstones or wall thickening visualized. No
sonographic Murphy sign noted by sonographer.

Common bile duct: Diameter: 1.4 mm

Liver: The hepatic echotexture is normal. The surface contour is
smooth. There is no intrahepatic ductal dilation. There is a
septated cyst in the right hepatic lobe measuring 1.2 x 1.0 x
cm. No solid masses are observed.

IVC: No abnormality visualized.

Pancreas: Visualization of the pancreatic head and tail is limited.
The pancreatic body is grossly normal.

Spleen: Size and appearance within normal limits.

Right Kidney: Length: 10.3 cm. Echogenicity within normal limits. No
mass or hydronephrosis visualized.

Left Kidney: Length: 9.7 cm. Echogenicity within normal limits. No
mass or hydronephrosis visualized.

Abdominal aorta: No aneurysm visualized.

Other findings: There is no ascites.
IMPRESSION: No gallstones or sonographic evidence of acute cholecystitis. If
there are clinical concerns of chronic cholecystitis, a nuclear
medicine hepatobiliary scan with gallbladder ejection fraction
determination may be useful.

Septated cystic structure in the right hepatic lobe likely reflects
a benign process. Six-month follow-up hepatic ultrasound is
recommended to re-evaluate this structure.

## 2020-03-18 ENCOUNTER — Emergency Department (HOSPITAL_COMMUNITY)
Admission: EM | Admit: 2020-03-18 | Discharge: 2020-03-18 | Disposition: A | Discharge status: Home / Self Care | Payer: Medicaid Other | Attending: Emergency Medicine | Admitting: Emergency Medicine

## 2020-03-18 ENCOUNTER — Other Ambulatory Visit: Payer: Self-pay

## 2020-03-18 ENCOUNTER — Encounter (HOSPITAL_COMMUNITY): Payer: Self-pay | Admitting: Emergency Medicine

## 2020-03-18 DIAGNOSIS — R0981 Nasal congestion: Secondary | ICD-10-CM | POA: Diagnosis not present

## 2020-03-18 DIAGNOSIS — H9201 Otalgia, right ear: Secondary | ICD-10-CM | POA: Insufficient documentation

## 2020-03-18 DIAGNOSIS — Z5321 Procedure and treatment not carried out due to patient leaving prior to being seen by health care provider: Secondary | ICD-10-CM | POA: Insufficient documentation

## 2020-03-18 NOTE — ED Notes (Signed)
Registration informed this NT that this patient left. 

## 2020-03-18 NOTE — ED Triage Notes (Signed)
Pt presents to ED POV. PT c/o R ear pain that began 1700. Pt also c/o congestion.

## 2020-03-19 ENCOUNTER — Encounter (HOSPITAL_COMMUNITY): Payer: Self-pay

## 2020-03-19 ENCOUNTER — Ambulatory Visit (HOSPITAL_COMMUNITY)
Admission: EM | Admit: 2020-03-19 | Discharge: 2020-03-19 | Disposition: A | Discharge status: Home / Self Care | Payer: Medicaid Other | Attending: Emergency Medicine | Admitting: Emergency Medicine

## 2020-03-19 ENCOUNTER — Other Ambulatory Visit: Payer: Self-pay

## 2020-03-19 DIAGNOSIS — H66001 Acute suppurative otitis media without spontaneous rupture of ear drum, right ear: Secondary | ICD-10-CM | POA: Diagnosis not present

## 2020-03-19 MED ORDER — AMOXICILLIN-POT CLAVULANATE 875-125 MG PO TABS: 1.0000 | ORAL_TABLET | Freq: Two times a day (BID) | ORAL | 0 refills | Status: AC

## 2020-03-19 NOTE — ED Triage Notes (Signed)
Pt presents to UC for right ear pain x2 days. Pt denies OTC treatments or relieving factors. Pt also c/o head ache. Pt denies cough, congestion, sore throat, body aches, chills, n/v/d. Pt seen for same on 8/6 at [facility].

## 2020-03-19 NOTE — Discharge Instructions (Addendum)
Push fluids to ensure adequate hydration and keep secretions thin.  Tylenol as needed for pain.  Complete course of antibiotics.  If symptoms worsen or do not improve in the next week to return to be seen or to follow up with your primary care provider.

## 2020-03-19 NOTE — ED Provider Notes (Signed)
[facility]    CSN: [id number] Arrival date & time: 03/19/20  1312      History   Chief Complaint Chief Complaint  Patient presents with  . Otalgia    HPI Jesse Garza is a 39 y.o. male.   Jesse Garza presents with complaints of right ear pain for the past two days. Has felt feverish. Pain extends to right face and jaw. No other URI symptoms. Hasn't taken any medications for symptoms.   Unable to connect to interpreter unfortunately.    ROS per HPI, negative if not otherwise mentioned.       Past Medical History:  Diagnosis Date  . GERD (gastroesophageal reflux disease)   . Tuberculosis     Patient Active Problem List   Diagnosis Date Noted  . 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 Smoking 03/16/2014  . Heel pain 03/16/2014  . Callus of heel 03/16/2014  . Dental cavities 03/16/2014    Past Surgical History:  Procedure Laterality Date  . NO PAST SURGERIES         Home Medications    Prior to Admission medications   Medication Sig Start Date End Date Taking? Authorizing Provider  amoxicillin-clavulanate (AUGMENTIN) 875-125 MG tablet Take 1 tablet by mouth every 12 (twelve) hours for 7 days. 03/19/20 03/26/20  Georgetta Haber, NP  bismuth-metronidazole-tetracycline (PYLERA) [phone] MG capsule Take 3 capsules by mouth 4 (four) times daily -  before meals and at bedtime. 12/05/16   Esterwood, Amy S, PA-C  lansoprazole (PREVACID) 30 MG capsule Take 1 capsule (30 mg total) by mouth daily at 12 noon. 11/21/16   Esterwood, Amy S, PA-C  omeprazole (PRILOSEC) 40 MG capsule Take 1 tab twice daily 01/25/17   Esterwood, Amy S, PA-C  sucralfate (CARAFATE) 1 GM/10ML suspension Take 10 mLs (1 g total) by mouth 4 (four) times daily -  with meals and at bedtime. For 14 days. 01/25/17   Esterwood, Amy S, PA-C    Family History Family History  Problem Relation Age of Onset  . Colon cancer Neg Hx   . Stomach cancer Neg Hx     Social History Social History    Tobacco Use  . Smoking status: Current Some Day Smoker    Types: Cigarettes  . Smokeless tobacco: Never Used  Vaping Use  . Vaping Use: Never assessed  Substance Use Topics  . Alcohol use: No  . Drug use: No     Allergies   Patient has no known allergies.   Review of Systems Review of Systems   Physical Exam Triage Vital Signs ED Triage Vitals  Enc Vitals Group     BP 03/19/20 1406 117/77     Pulse Rate 03/19/20 1406 90     Resp 03/19/20 1406 16     Temp 03/19/20 1406 99.8 F (37.7 C)     Temp Source 03/19/20 1406 Oral     SpO2 03/19/20 1406 90 %     Weight --      Height --      Head Circumference --      Peak Flow --      Pain Score 03/19/20 1407 10     Pain Loc --      Pain Edu? --      Excl. in GC? --    No data found.  Updated Vital Signs BP 117/77 (BP Location: Right Arm)   Pulse 90   Temp 99.8 F (37.7 C) (Oral)   Resp 16  SpO2 90%   Visual Acuity Right Eye Distance:   Left Eye Distance:   Bilateral Distance:    Right Eye Near:   Left Eye Near:    Bilateral Near:     Physical Exam Constitutional:      Appearance: He is well-developed.  HENT:     Right Ear: Tympanic membrane is injected and bulging.  Cardiovascular:     Rate and Rhythm: Normal rate.  Pulmonary:     Effort: Pulmonary effort is normal.  Skin:    General: Skin is warm and dry.  Neurological:     Mental Status: He is alert and oriented to person, place, and time.      UC Treatments / Results  Labs (all labs ordered are listed, but only abnormal results are displayed) Labs Reviewed - No data to display  EKG   Radiology No results found.  Procedures Procedures (including critical care time)  Medications Ordered in UC Medications - No data to display  Initial Impression / Assessment and Plan / UC Course  I have reviewed the triage vital signs and the nursing notes.  Pertinent labs & imaging results that were available during my care of the patient  were reviewed by me and considered in my medical decision making (see chart for details).     Findings of AOM with antibiotics provided. Return precautions provided. Patient verbalized understanding and agreeable to plan.   Final Clinical Impressions(s) / UC Diagnoses   Final diagnoses:  Non-recurrent acute suppurative otitis media of right ear without spontaneous rupture of tympanic membrane     Discharge Instructions     Push fluids to ensure adequate hydration and keep secretions thin.  Tylenol as needed for pain.  Complete course of antibiotics.  If symptoms worsen or do not improve in the next week to return to be seen or to follow up with your primary care provider.      ED Prescriptions    Medication Sig Dispense Auth. Provider   amoxicillin-clavulanate (AUGMENTIN) 875-125 MG tablet Take 1 tablet by mouth every 12 (twelve) hours for 7 days. 14 tablet Georgetta Haber, NP     PDMP not reviewed this encounter.   Georgetta Haber, NP 03/19/20 2136

## 2020-03-22 ENCOUNTER — Telehealth: Payer: Self-pay | Admitting: *Deleted

## 2020-03-22 NOTE — Telephone Encounter (Signed)
Jesse Garza presented to the ED and left before being seen by the provider on 03/18/20 The patient has been enrolled in an automated general discharge outreach program and 2 attempts to contact the patient will be made to follow up on their ED visit and subsequent needs. The care management team is available to provide assistance to this patient at any time.   Burnard Bunting, RN, BSN, CCRN Patient Engagement Center [phone]

## 2023-07-16 ENCOUNTER — Ambulatory Visit (INDEPENDENT_AMBULATORY_CARE_PROVIDER_SITE_OTHER): Payer: Medicaid Other

## 2023-07-16 ENCOUNTER — Ambulatory Visit (HOSPITAL_COMMUNITY)
Admission: EM | Admit: 2023-07-16 | Discharge: 2023-07-16 | Disposition: A | Discharge status: Home / Self Care | Payer: Medicaid Other | Attending: Family Medicine | Admitting: Family Medicine

## 2023-07-16 ENCOUNTER — Encounter (HOSPITAL_COMMUNITY): Payer: Self-pay

## 2023-07-16 DIAGNOSIS — J189 Pneumonia, unspecified organism: Secondary | ICD-10-CM

## 2023-07-16 LAB — POC COVID19/FLU A&B COMBO
Covid Antigen, POC: NEGATIVE
Influenza A Antigen, POC: NEGATIVE
Influenza B Antigen, POC: NEGATIVE

## 2023-07-16 MED ORDER — DOXYCYCLINE HYCLATE 100 MG PO CAPS: 100.0000 mg | ORAL_CAPSULE | Freq: Two times a day (BID) | ORAL | 0 refills | Status: AC

## 2023-07-16 MED ORDER — ONDANSETRON 4 MG PO TBDP: 4.0000 mg | ORAL_TABLET | Freq: Three times a day (TID) | ORAL | 0 refills | Status: AC | PRN

## 2023-07-16 MED ORDER — BENZONATATE 100 MG PO CAPS: 100.0000 mg | ORAL_CAPSULE | Freq: Three times a day (TID) | ORAL | 0 refills | Status: AC | PRN

## 2023-07-16 NOTE — ED Provider Notes (Addendum)
[facility]    CSN: [id number] Arrival date & time: 07/16/23  1047      History   Chief Complaint Chief Complaint  Patient presents with   Cough    HPI Jesse Garza is a 42 y.o. male.    Cough Here for cough and congestion and subjective fever and malaise.  He is also had myalgia. Symptoms began about November 30.  He is thrown up once, and continues to be nauseated. 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 Authorizing Provider  benzonatate (TESSALON) 100 MG capsule Take 1 capsule (100 mg total) by mouth 3 (three) times daily as needed for cough. 07/16/23  Yes Zenia Resides, MD  doxycycline (VIBRAMYCIN) 100 MG capsule Take 1 capsule (100 mg total) by mouth 2 (two) times daily for 7 days. 07/16/23 07/23/23 Yes Zenia Resides, MD  ondansetron (ZOFRAN-ODT) 4 MG disintegrating tablet Take 1 tablet (4 mg total) by mouth every 8 (eight) hours as needed for nausea or vomiting. 07/16/23  Yes Zenia Resides, MD    Family History Family History  Problem Relation Age of Onset   Colon cancer Neg Hx    Stomach cancer Neg Hx     Social History Social History   Tobacco Use   Smoking status: Some Days    Types: Cigarettes   Smokeless tobacco: Never  Substance Use Topics   Alcohol use: No   Drug use: No     Allergies   Patient has no known allergies.   Review of Systems Review of Systems  Respiratory:  Positive for cough.       Physical Exam Triage Vital Signs ED Triage Vitals  Encounter Vitals Group     BP 07/16/23 1140 127/83     Systolic BP Percentile --      Diastolic BP Percentile --      Pulse Rate 07/16/23 1140 95     Resp 07/16/23 1140 18     Temp 07/16/23 1140 98.6 F (37 C)     Temp Source 07/16/23 1140 Oral     SpO2 07/16/23 1140 94 %     Weight --      Height --      Head Circumference --      Peak Flow --      Pain Score 07/16/23 1141 8     Pain Loc --      Pain Education --      Exclude from Growth Chart --    No data found.  Updated Vital Signs BP 127/83 (BP Location: Right Arm)   Pulse 95   Temp 98.6 F (37 C) (Oral)   Resp 18   SpO2 94%   Visual Acuity Right Eye Distance:   Left Eye Distance:   Bilateral Distance:    Right Eye Near:   Left Eye Near:    Bilateral Near:  Physical Exam Vitals reviewed.  Constitutional:      General: He is not in acute distress.    Appearance: He is not ill-appearing, toxic-appearing or diaphoretic.  HENT:     Right Ear: Tympanic membrane and ear canal normal.     Left Ear: Tympanic membrane and ear canal normal.     Nose: Nose normal.     Mouth/Throat:     Mouth: Mucous membranes are moist.     Pharynx: No oropharyngeal exudate or posterior oropharyngeal erythema.  Eyes:     Extraocular Movements: Extraocular movements intact.     Conjunctiva/sclera: Conjunctivae normal.     Pupils: Pupils are equal, round, and reactive to light.  Cardiovascular:     Rate and Rhythm: Normal rate and regular rhythm.     Heart sounds: No murmur heard. Pulmonary:     Effort: No respiratory distress.     Breath sounds: No stridor. No wheezing, rhonchi or rales.     Comments: There are decreased breath sounds in the lower lung fields. Musculoskeletal:     Cervical back: Neck supple.  Lymphadenopathy:     Cervical: No cervical adenopathy.  Skin:    Coloration: Skin is not pale.  Neurological:     General: No focal deficit present.      Mental Status: He is alert and oriented to person, place, and time.  Psychiatric:        Behavior: Behavior normal.      UC Treatments / Results  Labs (all labs ordered are listed, but only abnormal results are displayed) Labs Reviewed  POC COVID19/FLU A&B COMBO    EKG   Radiology DG Chest 2 View  Result Date: 07/16/2023 CLINICAL DATA:  Cough with chest pain and subjective fever for 3 days. EXAM: CHEST - 2 VIEW COMPARISON:  Radiographs 12/22/2015 and 12/13/2015. FINDINGS: The heart size and mediastinal contours are stable. New left lower lobe airspace disease with increased density projecting over the lower thoracic spine on the lateral view. No significant obscuration of the left hemidiaphragm. The right lung appears clear. No pleural effusion or pneumothorax. The bones appear unremarkable. IMPRESSION: New left lower lobe airspace disease suspicious for pneumonia. Followup PA and lateral chest X-ray is recommended in 4-6 weeks following appropriate therapy to ensure resolution and exclude underlying malignancy. Electronically Signed   By: Carey Bullocks M.D.   On: 07/16/2023 12:47    Procedures Procedures (including critical care time)  Medications Ordered in UC Medications - No data to display  Initial Impression / Assessment and Plan / UC Course  I have reviewed the triage vital signs and the nursing notes.  Pertinent labs & imaging results that were available during my care of the patient were reviewed by me and considered in my medical decision making (see chart for details).    Visit was conducted with the help of video translation in Tigrinian  There is a left lower lobe infiltrate visible on PA and lateral views.  Doxycycline is sent in for pneumonia and Tessalon Perles are sent in for the cough. Also Zofran is sent in for the nausea Final Clinical Impressions(s) / UC Diagnoses   Final diagnoses:  Community acquired pneumonia of left lower lobe of lung      Discharge Instructions      I can see a pneumonia in your left lung on the x-ray. The radiologist will also read your x-ray, and if their interpretation differs significantly from mine, and would change management of your condition,  we will call you.(?? ???? ????? ???? ?? ?? ??? ???? ?? ??? ????? ???? ??? ???? ??? ????? ??? ??? ??? ??? ??????? ????? ???? ????? ???????? ??????? ???)  The COVID and flu test was negative (???? ???? ????? ???? ???? ????)  Take doxycycline 100 mg --1 capsule 2 times daily for 7 days; this is the antibiotic. (???????? 100 ????? --1 ???? ?? ???? 2 ?? ?7 ????? ????? ?? ?? ?? ??-????)  Take benzonatate 100 mg, 1 tab every 8 hours as needed for cough. (?????? 100 ?????? ???? ?? ?????? ?? ??? ??? 8 ???? 1 ?? ?????)  Ondansetron dissolved in the mouth every 8 hours as needed for nausea or vomiting.(???????? ???? ?? ???? ?? ?????? ?? ??? ??? 8 ???? ?? ?? ?????)       ED Prescriptions     Medication Sig Dispense Auth. Provider   doxycycline (VIBRAMYCIN) 100 MG capsule Take 1 capsule (100 mg total) by mouth 2 (two) times daily for 7 days. 14 capsule Ann Bohne, Janace Aris, MD   benzonatate (TESSALON) 100 MG capsule Take 1 capsule (100 mg total) by mouth 3 (three) times daily as needed for cough. 21 capsule Zenia Resides, MD   ondansetron (ZOFRAN-ODT) 4 MG disintegrating tablet Take 1 tablet (4 mg total) by mouth every 8 (eight) hours as needed for nausea or vomiting. 10 tablet Marlinda Mike Janace Aris, MD      PDMP not reviewed this encounter.   Zenia Resides, MD 07/16/23 1249    Zenia Resides, MD 07/16/23 1250    Zenia Resides, MD 07/16/23 [phone]

## 2023-07-16 NOTE — Discharge Instructions (Addendum)
I can see a pneumonia in your left lung on the x-ray. The radiologist will also read your x-ray, and if their interpretation differs significantly from mine, and would change management of your condition, we will call you.(?? ???? ????? ???? ?? ?? ??? ???? ?? ??? ????? ???? ??? ???? ??? ????? ??? ??? ??? ??? ??????? ????? ???? ????? ???????? ??????? ???)  The COVID and flu test was negative (???? ???? ????? ???? ???? ????)  Take doxycycline 100 mg --1 capsule 2 times daily for 7 days; this is the antibiotic. (???????? 100 ????? --1 ???? ?? ???? 2 ?? ?7 ????? ????? ?? ?? ?? ??-????)  Take benzonatate 100 mg, 1 tab every 8 hours as needed for cough. (?????? 100 ?????? ???? ?? ?????? ?? ??? ??? 8 ???? 1 ?? ?????)  Ondansetron dissolved in the mouth every 8 hours as needed for nausea or vomiting.(???????? ???? ?? ???? ?? ?????? ?? ??? ??? 8 ???? ?? ?? ?????)

## 2023-07-16 NOTE — ED Triage Notes (Signed)
Per interpreter, pt c/o cough, headache, chest congestion, sore throat, and chills x3 days. Denies taking any meds for sx's.
